# Patient Record
Sex: Male | Born: 1975 | Race: Black or African American | Hispanic: No | Marital: Married | Smoking: Current every day smoker
Health system: Southern US, Community
[De-identification: ages and names within clinical notes are randomized; demographics above are authoritative.]

---

## 2005-12-20 ENCOUNTER — Emergency Department (HOSPITAL_COMMUNITY): Admission: EM | Admit: 2005-12-20 | Discharge: 2005-12-20 | Payer: Self-pay | Admitting: Emergency Medicine

## 2006-03-17 ENCOUNTER — Emergency Department (HOSPITAL_COMMUNITY): Admission: EM | Admit: 2006-03-17 | Discharge: 2006-03-17 | Payer: Self-pay | Admitting: Emergency Medicine

## 2007-10-01 ENCOUNTER — Emergency Department (HOSPITAL_COMMUNITY): Admission: EM | Admit: 2007-10-01 | Discharge: 2007-10-01 | Payer: Self-pay | Admitting: Emergency Medicine

## 2007-11-20 ENCOUNTER — Emergency Department (HOSPITAL_COMMUNITY): Admission: EM | Admit: 2007-11-20 | Discharge: 2007-11-20 | Payer: Self-pay | Admitting: Emergency Medicine

## 2008-11-27 ENCOUNTER — Emergency Department (HOSPITAL_COMMUNITY): Admission: EM | Admit: 2008-11-27 | Discharge: 2008-11-27 | Payer: Self-pay | Admitting: Emergency Medicine

## 2009-07-21 ENCOUNTER — Emergency Department (HOSPITAL_COMMUNITY): Admission: EM | Admit: 2009-07-21 | Discharge: 2009-07-21 | Payer: Self-pay | Admitting: Emergency Medicine

## 2010-04-29 ENCOUNTER — Emergency Department (HOSPITAL_COMMUNITY): Admission: EM | Admit: 2010-04-29 | Discharge: 2010-04-29 | Payer: Self-pay | Admitting: Emergency Medicine

## 2010-09-29 ENCOUNTER — Emergency Department (HOSPITAL_COMMUNITY)
Admission: EM | Admit: 2010-09-29 | Discharge: 2010-09-29 | Payer: Self-pay | Source: Home / Self Care | Admitting: Emergency Medicine

## 2010-11-07 ENCOUNTER — Encounter: Payer: Self-pay | Admitting: Internal Medicine

## 2011-01-02 LAB — COMPREHENSIVE METABOLIC PANEL
AST: 22 U/L (ref 0–37)
Albumin: 4.5 g/dL (ref 3.5–5.2)
Alkaline Phosphatase: 51 U/L (ref 39–117)
BUN: 8 mg/dL (ref 6–23)
CO2: 29 mEq/L (ref 19–32)
Creatinine, Ser: 1 mg/dL (ref 0.4–1.5)
Sodium: 139 mEq/L (ref 135–145)

## 2011-01-02 LAB — URINALYSIS, ROUTINE W REFLEX MICROSCOPIC
Glucose, UA: NEGATIVE mg/dL
Ketones, ur: NEGATIVE mg/dL
Nitrite: NEGATIVE
Specific Gravity, Urine: <1.005 — ABNORMAL LOW (ref 1.005–1.030)

## 2011-01-02 LAB — CBC
Hemoglobin: 14.3 g/dL (ref 13.0–17.0)
MCH: 30.2 pg (ref 26.0–34.0)
MCHC: 33.2 g/dL (ref 30.0–36.0)
RBC: 4.74 MIL/uL (ref 4.22–5.81)
RDW: 12 % (ref 11.5–15.5)
WBC: 10.3 10*3/uL (ref 4.0–10.5)

## 2011-01-02 LAB — DIFFERENTIAL
Eosinophils Absolute: 0.5 10*3/uL (ref 0.0–0.7)
Lymphs Abs: 2.7 10*3/uL (ref 0.7–4.0)
Monocytes Absolute: 0.8 10*3/uL (ref 0.1–1.0)
Monocytes Relative: 8 % (ref 3–12)
Neutro Abs: 6 10*3/uL (ref 1.7–7.7)

## 2011-01-02 LAB — LIPASE, BLOOD: Lipase: 19 U/L (ref 11–59)

## 2011-02-01 LAB — DIFFERENTIAL
Basophils Absolute: 0.1 10*3/uL (ref 0.0–0.1)
Eosinophils Absolute: 0.4 10*3/uL (ref 0.0–0.7)
Eosinophils Relative: 4 % (ref 0–5)
Lymphocytes Relative: 20 % (ref 12–46)
Monocytes Relative: 11 % (ref 3–12)
Neutrophils Relative %: 64 % (ref 43–77)

## 2011-02-01 LAB — COMPREHENSIVE METABOLIC PANEL
Albumin: 3.8 g/dL (ref 3.5–5.2)
CO2: 29 mEq/L (ref 19–32)
Calcium: 9 mg/dL (ref 8.4–10.5)
Creatinine, Ser: 1.02 mg/dL (ref 0.4–1.5)
GFR calc Af Amer: >60 mL/min (ref >60)
GFR calc non Af Amer: >60 mL/min (ref >60)
Sodium: 137 mEq/L (ref 135–145)
Total Bilirubin: 0.5 mg/dL (ref 0.3–1.2)

## 2011-02-01 LAB — CBC
Hemoglobin: 13.6 g/dL (ref 13.0–17.0)
MCHC: 33.9 g/dL (ref 30.0–36.0)
Platelets: 243 10*3/uL (ref 150–400)
RBC: 4.48 MIL/uL (ref 4.22–5.81)
RDW: 12.6 % (ref 11.5–15.5)
WBC: 10.2 10*3/uL (ref 4.0–10.5)

## 2011-02-01 LAB — URINALYSIS, ROUTINE W REFLEX MICROSCOPIC
Glucose, UA: NEGATIVE mg/dL
Hgb urine dipstick: NEGATIVE
Nitrite: NEGATIVE
Protein, ur: NEGATIVE mg/dL
Specific Gravity, Urine: 1.02 (ref 1.005–1.030)
Urobilinogen, UA: 0.2 mg/dL (ref 0.0–1.0)

## 2011-07-21 ENCOUNTER — Encounter: Payer: Self-pay | Admitting: Emergency Medicine

## 2011-07-21 ENCOUNTER — Emergency Department (HOSPITAL_COMMUNITY)
Admission: EM | Admit: 2011-07-21 | Discharge: 2011-07-21 | Disposition: A | Discharge status: Home / Self Care | Payer: Self-pay | Attending: Emergency Medicine | Admitting: Emergency Medicine

## 2011-07-21 ENCOUNTER — Emergency Department (HOSPITAL_COMMUNITY): Payer: Self-pay

## 2011-07-21 DIAGNOSIS — R110 Nausea: Secondary | ICD-10-CM | POA: Insufficient documentation

## 2011-07-21 DIAGNOSIS — R3129 Other microscopic hematuria: Secondary | ICD-10-CM | POA: Insufficient documentation

## 2011-07-21 DIAGNOSIS — R350 Frequency of micturition: Secondary | ICD-10-CM | POA: Insufficient documentation

## 2011-07-21 DIAGNOSIS — Z87891 Personal history of nicotine dependence: Secondary | ICD-10-CM | POA: Insufficient documentation

## 2011-07-21 DIAGNOSIS — R109 Unspecified abdominal pain: Secondary | ICD-10-CM | POA: Insufficient documentation

## 2011-07-21 LAB — URINE MICROSCOPIC-ADD ON

## 2011-07-21 LAB — URINALYSIS, ROUTINE W REFLEX MICROSCOPIC: Bilirubin Urine: NEGATIVE

## 2011-07-21 LAB — GLUCOSE, CAPILLARY: Glucose-Capillary: 106 mg/dL — ABNORMAL HIGH (ref 70–99)

## 2011-07-21 NOTE — ED Notes (Signed)
Pt blood sugar was 106

## 2011-07-21 NOTE — ED Provider Notes (Signed)
Medical screening examination/treatment/procedure(s) were performed by non-physician practitioner and as supervising physician I was immediately available for consultation/collaboration.   Joya Gaskins, MD 07/21/11 [phone]

## 2011-07-21 NOTE — ED Notes (Signed)
Pt is currently in CT.

## 2011-07-21 NOTE — ED Notes (Signed)
PT c/o L side abd pain started this am. States had a bm and felt a little better. Denies trouble urinating. bm this am was normal and denies bloody or black stools. Cramping pain.

## 2011-07-21 NOTE — ED Provider Notes (Signed)
History     CSN: [id number] Arrival date & time: 07/21/2011 11:57 AM  Chief Complaint  Patient presents with  . Abdominal Pain    (Consider location/radiation/quality/duration/timing/severity/associated sxs/prior treatment) Patient is a 35 y.o. male presenting with abdominal pain. The history is provided by the patient.  Abdominal Pain The primary symptoms of the illness include abdominal pain and nausea. The primary symptoms of the illness do not include fever, shortness of breath, vomiting, diarrhea or dysuria. Primary symptoms comment: He reports approximately 1 hour episode of severe left side and left lower abdominal pain which improved shortly after having a bowel movement this morning.  He states the pain was severe and "balled him up".  He also had increased urinary frequency.   The current episode started 3 to 5 hours ago. The onset of the illness was sudden. The problem has been resolved.  The pain came on suddenly. The abdominal pain has been resolved since its onset. The abdominal pain is located in the LLQ and left flank. The abdominal pain does not radiate. The severity of the abdominal pain is 7/10 (He reports a normal bm this am,  denies diarrhea and constipation). The abdominal pain is relieved by bowel movement.  Additional symptoms associated with the illness include frequency. Symptoms associated with the illness do not include constipation or hematuria.    History reviewed. No pertinent past medical history.  History reviewed. No pertinent past surgical history.  History reviewed. No pertinent family history.  History  Substance Use Topics  . Smoking status: Former Games developer  . Smokeless tobacco: Not on file  . Alcohol Use: No      Review of Systems  Constitutional: Negative for fever.  HENT: Negative for congestion, sore throat and neck pain.   Eyes: Negative.   Respiratory: Negative for chest tightness and shortness of breath.   Cardiovascular: Negative for  chest pain.  Gastrointestinal: Positive for nausea and abdominal pain. Negative for vomiting, diarrhea, constipation, blood in stool and rectal pain.  Genitourinary: Positive for frequency. Negative for dysuria and hematuria.  Musculoskeletal: Negative for joint swelling and arthralgias.  Skin: Negative.  Negative for rash and wound.  Neurological: Negative for dizziness, weakness, light-headedness, numbness and headaches.  Hematological: Negative.   Psychiatric/Behavioral: Negative.     Allergies  Codeine  Home Medications  No current outpatient prescriptions on file.  BP 133/70  Pulse 73  Temp(Src) 98.6 F (37 C) (Oral)  Resp 20  Ht 6\' 3"  (1.905 m)  Wt 178 lb 11.2 oz (81.058 kg)  BMI 22.34 kg/m2  SpO2 100%  Physical Exam  Nursing note and vitals reviewed. Constitutional: He is oriented to person, place, and time. He appears well-developed and well-nourished.  HENT:  Head: Normocephalic and atraumatic.  Eyes: Conjunctivae are normal.  Neck: Normal range of motion.  Cardiovascular: Normal rate, regular rhythm, normal heart sounds and intact distal pulses.   Pulmonary/Chest: Effort normal and breath sounds normal. He has no wheezes.  Abdominal: Soft. Bowel sounds are normal. He exhibits no mass. There is no tenderness. There is no rebound and no guarding.  Musculoskeletal: Normal range of motion.  Neurological: He is alert and oriented to person, place, and time.  Skin: Skin is warm and dry.  Psychiatric: He has a normal mood and affect.    ED Course  Procedures (including critical care time)  Labs Reviewed  URINALYSIS, ROUTINE W REFLEX MICROSCOPIC - Abnormal; Notable for the following:    Hgb urine dipstick MODERATE (*)  All other components within normal limits  GLUCOSE, CAPILLARY - Abnormal; Notable for the following:    Glucose-Capillary 106 (*)    All other components within normal limits  URINE MICROSCOPIC-ADD ON   Ct Abdomen Pelvis Wo  Contrast  07/21/2011  *RADIOLOGY REPORT*  Clinical Data: Left flank pain and microhematuria.  CT ABDOMEN AND PELVIS WITHOUT CONTRAST  Technique:  Multidetector CT imaging of the abdomen and pelvis was performed following the standard protocol without intravenous contrast.  Comparison: CT scan 04/29/2010.  Findings: The lung bases are clear.  The solid abdominal organs are unremarkable without IV contrast. No focal lesions or inflammatory changes.  No renal or obstructing ureteral calculi.  No bladder calculi.  The stomach, duodenum, small bowel and colon are grossly normal without oral contrast.  No mesenteric or retroperitoneal masses or lymphadenopathy.  The aorta is normal in caliber. The appendix is normal except for a small appendicolith.  The bladder, prostate gland and seminal vesicles are unremarkable. The bony structures are unremarkable.  IMPRESSION: No acute abdominal/pelvic findings, mass lesions or adenopathy.  No renal or obstructing ureteral calculi.  Original Report Authenticated By: P. Loralie Champagne, M.D.     1. Abdominal pain   2. Hematuria, microscopic       MDM  Suspect patient may have passed kidney stone given hematuria,  Resolved location of pain.  Recommended f/u pcp in 1-2 weeks for recheck of urine.        Candis Musa, PA 07/21/11 1507

## 2011-07-26 ENCOUNTER — Emergency Department (HOSPITAL_COMMUNITY)
Admission: EM | Admit: 2011-07-26 | Discharge: 2011-07-26 | Disposition: A | Discharge status: Home / Self Care | Payer: Self-pay | Attending: Emergency Medicine | Admitting: Emergency Medicine

## 2011-07-26 ENCOUNTER — Encounter (HOSPITAL_COMMUNITY): Payer: Self-pay | Admitting: *Deleted

## 2011-07-26 DIAGNOSIS — X500XXA Overexertion from strenuous movement or load, initial encounter: Secondary | ICD-10-CM | POA: Insufficient documentation

## 2011-07-26 DIAGNOSIS — R300 Dysuria: Secondary | ICD-10-CM | POA: Insufficient documentation

## 2011-07-26 DIAGNOSIS — S39011A Strain of muscle, fascia and tendon of abdomen, initial encounter: Secondary | ICD-10-CM

## 2011-07-26 DIAGNOSIS — F17200 Nicotine dependence, unspecified, uncomplicated: Secondary | ICD-10-CM | POA: Insufficient documentation

## 2011-07-26 DIAGNOSIS — IMO0002 Reserved for concepts with insufficient information to code with codable children: Secondary | ICD-10-CM | POA: Insufficient documentation

## 2011-07-26 DIAGNOSIS — R109 Unspecified abdominal pain: Secondary | ICD-10-CM | POA: Insufficient documentation

## 2011-07-26 LAB — URINALYSIS, ROUTINE W REFLEX MICROSCOPIC
Glucose, UA: NEGATIVE mg/dL
Hgb urine dipstick: NEGATIVE
Nitrite: NEGATIVE
Specific Gravity, Urine: 1.025 (ref 1.005–1.030)
pH: 6 (ref 5.0–8.0)

## 2011-07-26 NOTE — ED Provider Notes (Signed)
History     CSN: [id number] Arrival date & time: 07/26/2011  9:21 AM  Chief Complaint  Patient presents with  . Abdominal Pain    (Consider location/radiation/quality/duration/timing/severity/associated sxs/prior treatment) HPI Comments: Pt states he had a kidney stone ~ 1 week ago.  His pain today is not similar.  He lifted a heavy object at work last PM and had an immediate "burning" sensation around his umbilicus.  It also was uncomfortable when he urinated earlier.    No n/v/d.  No fever.    Patient is a 35 y.o. male presenting with abdominal pain. The history is provided by the patient. No language interpreter was used.  Abdominal Pain The primary symptoms of the illness include abdominal pain and dysuria. The primary symptoms of the illness do not include fever, nausea, vomiting, diarrhea, hematemesis or hematochezia. The current episode started less than 1 hour ago. The onset of the illness was sudden.  The abdominal pain began less than 1 hour ago. The pain came on suddenly. The abdominal pain is located in the periumbilical region. The abdominal pain does not radiate. The abdominal pain is relieved by nothing. The abdominal pain is exacerbated by urination.  The dysuria is not associated with hematuria, frequency, urgency or penile pain.  Symptoms associated with the illness do not include diaphoresis, urgency, hematuria, frequency or back pain. Associated medical issues comments: kidney stones..    History reviewed. No pertinent past medical history.  History reviewed. No pertinent past surgical history.  History reviewed. No pertinent family history.  History  Substance Use Topics  . Smoking status: Current Everyday Smoker -- 0.5 packs/day  . Smokeless tobacco: Not on file  . Alcohol Use: No      Review of Systems  Constitutional: Negative for fever and diaphoresis.  Gastrointestinal: Positive for abdominal pain. Negative for nausea, vomiting, diarrhea, hematochezia  and hematemesis.  Genitourinary: Positive for dysuria. Negative for urgency, frequency, hematuria, flank pain, discharge, penile swelling, scrotal swelling, penile pain and testicular pain.  Musculoskeletal: Negative for back pain.  All other systems reviewed and are negative.    Allergies  Codeine  Home Medications  No current outpatient prescriptions on file.  BP 143/81  Pulse 77  Temp(Src) 97.9 F (36.6 C) (Oral)  Resp 16  Ht 6\' 3"  (1.905 m)  Wt 178 lb (80.74 kg)  BMI 22.25 kg/m2  SpO2 100%  Physical Exam  Nursing note and vitals reviewed. Constitutional: He is oriented to person, place, and time. Vital signs are normal. He appears well-developed and well-nourished. No distress.  HENT:  Head: Normocephalic and atraumatic.  Right Ear: External ear normal.  Left Ear: External ear normal.  Nose: Nose normal.  Mouth/Throat: No oropharyngeal exudate.  Eyes: Conjunctivae and EOM are normal. Pupils are equal, round, and reactive to light. Right eye exhibits no discharge. Left eye exhibits no discharge. No scleral icterus.  Neck: Normal range of motion. Neck supple. No JVD present. No tracheal deviation present. No thyromegaly present.  Cardiovascular: Normal rate, regular rhythm, normal heart sounds, intact distal pulses and normal pulses.  Exam reveals no gallop and no friction rub.   No murmur heard. Pulmonary/Chest: Effort normal and breath sounds normal. No stridor. No respiratory distress. He has no wheezes. He has no rales. He exhibits no tenderness.  Abdominal: Soft. Normal appearance and bowel sounds are normal. He exhibits no shifting dullness, no distension, no pulsatile liver, no fluid wave, no abdominal bruit, no ascites, no pulsatile midline mass and no mass.  There is no hepatosplenomegaly. There is no tenderness. There is no rebound, no guarding and no CVA tenderness. No hernia. Hernia confirmed negative in the right inguinal area and confirmed negative in the left  inguinal area.         No RLQ or RUQ pain or PT.  Genitourinary: Testes normal and penis normal. Right testis shows no mass, no swelling and no tenderness. Right testis is descended. Left testis shows no mass, no swelling and no tenderness. Left testis is descended. Circumcised. No phimosis, paraphimosis, hypospadias, penile erythema or penile tenderness. No discharge found.  Musculoskeletal: Normal range of motion. He exhibits no edema and no tenderness.  Lymphadenopathy:    He has no cervical adenopathy.       Right: No inguinal adenopathy present.       Left: No inguinal adenopathy present.  Neurological: He is alert and oriented to person, place, and time. He has normal reflexes. Coordination normal. GCS eye subscore is 4. GCS verbal subscore is 5. GCS motor subscore is 6.  Skin: Skin is warm and dry. No rash noted. He is not diaphoretic.  Psychiatric: He has a normal mood and affect. His speech is normal and behavior is normal. Judgment and thought content normal. Cognition and memory are normal.    ED Course  Procedures (including critical care time)  Labs Reviewed - No data to display No results found.   No diagnosis found.    MDM    Medical screening examination/treatment/procedure(s) were performed by non-physician practitioner and as supervising physician I was immediately available for consultation/collaboration.      Worthy Rancher, PA 07/26/11 1610  Suzi Roots, MD 07/26/11 1058

## 2011-07-26 NOTE — ED Notes (Signed)
Pt c/o abd pain. Pt states he feels like he has pulled a muscle at work yesterday. Pt states pain in located around navel.

## 2012-07-04 ENCOUNTER — Emergency Department (HOSPITAL_COMMUNITY)
Admission: EM | Admit: 2012-07-04 | Discharge: 2012-07-04 | Disposition: A | Discharge status: Home / Self Care | Payer: Self-pay | Attending: Emergency Medicine | Admitting: Emergency Medicine

## 2012-07-04 ENCOUNTER — Other Ambulatory Visit: Payer: Self-pay

## 2012-07-04 ENCOUNTER — Emergency Department (HOSPITAL_COMMUNITY): Payer: Self-pay

## 2012-07-04 ENCOUNTER — Encounter (HOSPITAL_COMMUNITY): Payer: Self-pay | Admitting: Emergency Medicine

## 2012-07-04 DIAGNOSIS — F17200 Nicotine dependence, unspecified, uncomplicated: Secondary | ICD-10-CM | POA: Insufficient documentation

## 2012-07-04 DIAGNOSIS — R0789 Other chest pain: Secondary | ICD-10-CM

## 2012-07-04 DIAGNOSIS — Z7982 Long term (current) use of aspirin: Secondary | ICD-10-CM | POA: Insufficient documentation

## 2012-07-04 DIAGNOSIS — Z885 Allergy status to narcotic agent status: Secondary | ICD-10-CM | POA: Insufficient documentation

## 2012-07-04 DIAGNOSIS — Z91040 Latex allergy status: Secondary | ICD-10-CM | POA: Insufficient documentation

## 2012-07-04 DIAGNOSIS — R079 Chest pain, unspecified: Secondary | ICD-10-CM | POA: Insufficient documentation

## 2012-07-04 MED ORDER — HYDROCODONE-ACETAMINOPHEN 5-325 MG PO TABS
1.0000 | ORAL_TABLET | Freq: Once | ORAL | Status: AC
  Administered 2012-07-04: 1 via ORAL
  Filled 2012-07-04: qty 1

## 2012-07-04 MED ORDER — TRAMADOL HCL 50 MG PO TABS: 50.0000 mg | ORAL_TABLET | Freq: Four times a day (QID) | ORAL | Status: DC | PRN

## 2012-07-04 NOTE — ED Provider Notes (Cosign Needed)
History  This chart was scribed for Benny Lennert, MD by Shari Heritage. The patient was seen in room APA10/APA10. Patient's care was started at 1210.     CSN: [id number]  Arrival date & time 07/04/12  1036   First MD Initiated Contact with Patient 07/04/12 1210      Chief Complaint  Patient presents with  . Chest Pain    Patient is a 36 y.o. male presenting with chest pain. The history is provided by the patient. No language interpreter was used.  Chest Pain The chest pain began 2 days ago. Chest pain occurs constantly. The chest pain is worsening. The severity of the pain is moderate. The quality of the pain is described as aching. The pain does not radiate. Chest pain is worsened by exertion. Pertinent negatives for primary symptoms include no fatigue, no shortness of breath, no cough and no abdominal pain.  Pertinent negatives for associated symptoms include no diaphoresis. He tried aspirin for the symptoms. Risk factors include no known risk factors.  Pertinent negatives for past medical history include no seizures.      Chad Christian is a 36 y.o. male who presents to the Emergency Department complaining of aching, moderate to severe, constant mid chest pain onset 2 days ago that worsened last night. The pain worsens with movement. Patient states that he thinks the pain may be muscular because he lifts heavy objects at his job. He took Bayer Aspirin last night with only mild relief. He denies diaphoresis or SOB. Patient reports no other significant past medical or surgical history. He is a current every day smoker.  History reviewed. No pertinent past medical history.  History reviewed. No pertinent past surgical history.  No family history on file.  History  Substance Use Topics  . Smoking status: Current Every Day Smoker -- 0.5 packs/day  . Smokeless tobacco: Not on file  . Alcohol Use: No      Review of Systems  Constitutional: Negative for diaphoresis and  fatigue.  HENT: Negative for congestion, sinus pressure and ear discharge.   Eyes: Negative for discharge.  Respiratory: Negative for cough and shortness of breath.   Cardiovascular: Positive for chest pain.  Gastrointestinal: Negative for abdominal pain and diarrhea.  Genitourinary: Negative for frequency and hematuria.  Musculoskeletal: Negative for back pain.  Skin: Negative for rash.  Neurological: Negative for seizures and headaches.  Hematological: Negative.   Psychiatric/Behavioral: Negative for hallucinations.    Allergies  Codeine and Latex  Home Medications   Current Outpatient Rx  Name Route Sig Dispense Refill  . ASPIRIN 325 MG PO TABS Oral Take 650 mg by mouth every 8 (eight) hours as needed. Pain    . CALCIUM CARBONATE ANTACID 500 MG PO CHEW Oral Chew 1 tablet by mouth 3 (three) times daily as needed. Acid Reflux      BP 137/81  Pulse 67  Temp 97.4 F (36.3 C) (Oral)  Resp 18  Ht 6\' 1"  (1.854 m)  Wt 170 lb (77.111 kg)  BMI 22.43 kg/m2  SpO2 100%  Physical Exam  Constitutional: He is oriented to person, place, and time. He appears well-developed.  HENT:  Head: Normocephalic and atraumatic.  Eyes: Conjunctivae normal and EOM are normal. No scleral icterus.  Neck: Neck supple. No thyromegaly present.  Cardiovascular: Normal rate and regular rhythm.  Exam reveals no gallop and no friction rub.   No murmur heard. Pulmonary/Chest: No stridor. He has no wheezes. He has no rales. He  exhibits tenderness.       Mild tenderness to sternum.  Abdominal: He exhibits no distension. There is no tenderness. There is no rebound.  Musculoskeletal: Normal range of motion. He exhibits no edema.  Lymphadenopathy:    He has no cervical adenopathy.  Neurological: He is oriented to person, place, and time. Coordination normal.  Skin: No rash noted. No erythema.  Psychiatric: He has a normal mood and affect. His behavior is normal.    ED Course  Procedures (including  critical care time) DIAGNOSTIC STUDIES: Oxygen Saturation is 100% on room air, normal by my interpretation.    COORDINATION OF CARE: 12:15pm- Patient informed of current plan for treatment and evaluation and agrees with plan at this time.      Labs Reviewed - No data to display No results found.   No diagnosis found.   Date: 07/04/2012  Rate64  Rhythm: normal sinus rhythm  QRS Axis: normal  Intervals: normal  ST/T Wave abnormalities: early repolarization  Conduction Disutrbances:none  Narrative Interpretation:   Old EKG Reviewed: none available    MDM        The chart was scribed for me under my direct supervision.  I personally performed the history, physical, and medical decision making and all procedures in the evaluation of this patient.Benny Lennert, MD 07/04/12 1425  Benny Lennert, MD 07/04/12 [phone]

## 2012-07-04 NOTE — ED Notes (Signed)
Pt c/o chest pain x2 days. Pt describes pain and tight and pulling. Pt states he does a lot of heavy lifting at work and thinks he may have pulled a muscle. Pt states "I pulled my pectoris muscle before and this feels the same". Pt denies SOB, dizziness, or any other symptoms.

## 2012-07-04 NOTE — ED Notes (Signed)
Patient with c/o mid and left chest pain that started two days ago while working. Pain is worse with movement.

## 2012-07-11 ENCOUNTER — Encounter (HOSPITAL_COMMUNITY): Payer: Self-pay | Admitting: Emergency Medicine

## 2012-07-11 DIAGNOSIS — F17200 Nicotine dependence, unspecified, uncomplicated: Secondary | ICD-10-CM | POA: Insufficient documentation

## 2012-07-11 DIAGNOSIS — Z91040 Latex allergy status: Secondary | ICD-10-CM | POA: Insufficient documentation

## 2012-07-11 DIAGNOSIS — R071 Chest pain on breathing: Secondary | ICD-10-CM | POA: Insufficient documentation

## 2012-07-11 DIAGNOSIS — Z886 Allergy status to analgesic agent status: Secondary | ICD-10-CM | POA: Insufficient documentation

## 2012-07-11 NOTE — ED Notes (Signed)
Patient c/o continued chest pain; was seen on 07/04/12 for same.  Patient states he feels like he has pulled a muscle in his chest.  No shortness of breath, N/V.

## 2012-07-12 ENCOUNTER — Emergency Department (HOSPITAL_COMMUNITY)
Admission: EM | Admit: 2012-07-12 | Discharge: 2012-07-12 | Disposition: A | Discharge status: Home / Self Care | Payer: Self-pay | Attending: Emergency Medicine | Admitting: Emergency Medicine

## 2012-07-12 DIAGNOSIS — R0789 Other chest pain: Secondary | ICD-10-CM

## 2012-07-12 MED ORDER — IBUPROFEN 800 MG PO TABS: 800.0000 mg | ORAL_TABLET | Freq: Three times a day (TID) | ORAL | Status: AC

## 2012-07-12 MED ORDER — IBUPROFEN 800 MG PO TABS
800.0000 mg | ORAL_TABLET | Freq: Once | ORAL | Status: AC
  Administered 2012-07-12: 800 mg via ORAL
  Filled 2012-07-12: qty 1

## 2012-07-12 NOTE — ED Notes (Signed)
Pt alert & oriented x4, stable gait. Patient given discharge instructions, paperwork & prescription(s). Patient  instructed to stop at the registration desk to finish any additional paperwork. Patient verbalized understanding. Pt left department w/ no further questions. 

## 2012-07-12 NOTE — ED Provider Notes (Signed)
History     CSN: [id number]  Arrival date & time 07/11/12  2319   First MD Initiated Contact with Patient 07/12/12 0023      Chief Complaint  Patient presents with  . Muscle Pain    (Consider location/radiation/quality/duration/timing/severity/associated sxs/prior treatment) HPI Chad Christian is a 36 y.o. male who presents to the Emergency Department complaining of recurrent anterior chest pain that is worse when he moves or with palpation. He was seen for similar pain on 07/04/12, dx with muscular pain and given tramadol which he did not take. 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 He has taken no medicines. Denies fever, chills, cough, shortness of breath.  PCP Dr. Sherwood Gambler    History reviewed. No pertinent past medical history.  History reviewed. No pertinent past surgical history.  No family history on file.  History  Substance Use Topics  . Smoking status: Current Every Day Smoker -- 0.5 packs/day  . Smokeless tobacco: Not on file  . Alcohol Use: No      Review of Systems  Constitutional: Negative for fever.       10 Systems reviewed and are negative for acute change except as noted in the HPI.  HENT: Negative for congestion.   Eyes: Negative for discharge and redness.  Respiratory: Negative for cough and shortness of breath.        Chest discomfort.  Cardiovascular: Negative for chest pain.  Gastrointestinal: Negative for vomiting and abdominal pain.  Musculoskeletal: Negative for back pain.  Skin: Negative for rash.  Neurological: Negative for syncope, numbness and headaches.  Psychiatric/Behavioral:       No behavior change.    Allergies  Codeine and Latex  Home Medications   Current Outpatient Rx  Name Route Sig Dispense Refill  . ASPIRIN 325 MG PO TABS Oral Take 650 mg by mouth every 8 (eight) hours as needed. Pain    . CALCIUM CARBONATE ANTACID 500 MG PO CHEW Oral Chew 1 tablet by mouth 3 (three) times daily as needed. Acid Reflux    .  IBUPROFEN 800 MG PO TABS Oral Take 1 tablet (800 mg total) by mouth 3 (three) times daily. 21 tablet 0  . TRAMADOL HCL 50 MG PO TABS Oral Take 1 tablet (50 mg total) by mouth every 6 (six) hours as needed for pain. 20 tablet 0    BP 133/83  Pulse 86  Temp 98.2 F (36.8 C) (Oral)  Resp 18  Ht 6\' 2"  (1.88 m)  Wt 185 lb (83.915 kg)  BMI 23.75 kg/m2  SpO2 97%  Physical Exam  Nursing note and vitals reviewed. Constitutional:       Awake, alert, nontoxic appearance.  HENT:  Head: Atraumatic.  Eyes: Right eye exhibits no discharge. Left eye exhibits no discharge.  Neck: Neck supple.  Cardiovascular: Normal heart sounds.   Pulmonary/Chest: Effort normal and breath sounds normal. He has no wheezes. He has no rales. He exhibits no tenderness.       Tenderness to sternal border both right and left with palpation. No crepitus, no deformity.  Abdominal: Soft. There is no tenderness. There is no rebound.  Musculoskeletal: He exhibits no tenderness.       Baseline ROM, no obvious new focal weakness.  Neurological:       Mental status and motor strength appears baseline for patient and situation.  Skin: No rash noted.  Psychiatric: He has a normal mood and affect.    ED Course  Procedures (including  critical care time)  Labs Reviewed - No data to display No results found.   1. Chest wall pain       MDM  Patient with chest wall pain that has been seen for similar pain and chose not to take prescribed medicine. Given ibuprofen here and he agreed to a Rx for the same. Pt stable in ED with no significant deterioration in condition.The patient appears reasonably screened and/or stabilized for discharge and I doubt any other medical condition or other [facility] requiring further screening, evaluation, or treatment in the ED at this time prior to discharge.  MDM Reviewed: nursing note, vitals and previous chart           Nicoletta Dress. Colon Branch, MD 07/12/12 [phone]

## 2012-07-12 NOTE — ED Notes (Signed)
Pt states feels like he strained a muscle in his chest again today. Was seen last week for the same. Pain more in the center increased pain w/ movement

## 2012-07-30 ENCOUNTER — Emergency Department (HOSPITAL_COMMUNITY): Payer: Self-pay

## 2012-07-30 ENCOUNTER — Encounter (HOSPITAL_COMMUNITY): Payer: Self-pay | Admitting: *Deleted

## 2012-07-30 ENCOUNTER — Emergency Department (HOSPITAL_COMMUNITY)
Admission: EM | Admit: 2012-07-30 | Discharge: 2012-07-30 | Disposition: A | Discharge status: Home / Self Care | Payer: Self-pay | Attending: Emergency Medicine | Admitting: Emergency Medicine

## 2012-07-30 DIAGNOSIS — F17200 Nicotine dependence, unspecified, uncomplicated: Secondary | ICD-10-CM | POA: Insufficient documentation

## 2012-07-30 DIAGNOSIS — R109 Unspecified abdominal pain: Secondary | ICD-10-CM

## 2012-07-30 DIAGNOSIS — R1011 Right upper quadrant pain: Secondary | ICD-10-CM | POA: Insufficient documentation

## 2012-07-30 DIAGNOSIS — R1012 Left upper quadrant pain: Secondary | ICD-10-CM | POA: Insufficient documentation

## 2012-07-30 NOTE — ED Notes (Signed)
Pt discharged. Pt stable at time of discharge. pt has no questions regarding discharge at this time. Pt voiced understanding of discharge instructions.  

## 2012-07-30 NOTE — ED Provider Notes (Signed)
History     CSN: [id number]  Arrival date & time 07/30/12  0005   First MD Initiated Contact with Patient 07/30/12 0153      Chief Complaint  Patient presents with  . Abdominal Pain    (Consider location/radiation/quality/duration/timing/severity/associated sxs/prior treatment) HPI Chad Christian is a 36 y.o. male who presents to the Emergency Department complaining of right sided abdominal pain he has had for several days which became worse tonight. Last BM just prior to coming to the ER and was normal. He denies fever, chills, nausea, vomiting.   PCP Dr. Sherwood Gambler   History reviewed. No pertinent past medical history.  History reviewed. No pertinent past surgical history.  History reviewed. No pertinent family history.  History  Substance Use Topics  . Smoking status: Current Every Day Smoker    Types: Cigarettes  . Smokeless tobacco: Not on file  . Alcohol Use: No      Review of Systems  Constitutional: Negative for fever.       10 Systems reviewed and are negative for acute change except as noted in the HPI.  HENT: Negative for congestion.   Eyes: Negative for discharge and redness.  Respiratory: Negative for cough and shortness of breath.   Cardiovascular: Negative for chest pain.  Gastrointestinal: Positive for abdominal pain. Negative for vomiting.  Musculoskeletal: Negative for back pain.  Skin: Negative for rash.  Neurological: Negative for syncope, numbness and headaches.  Psychiatric/Behavioral:       No behavior change.    Allergies  Codeine and Latex  Home Medications   Current Outpatient Rx  Name Route Sig Dispense Refill  . ASPIRIN 325 MG PO TABS Oral Take 650 mg by mouth every 8 (eight) hours as needed. Pain    . CALCIUM CARBONATE ANTACID 500 MG PO CHEW Oral Chew 1 tablet by mouth 3 (three) times daily as needed. Acid Reflux    . IBUPROFEN 800 MG PO TABS Oral Take 1 tablet (800 mg total) by mouth 3 (three) times daily. 21 tablet 0  .  TRAMADOL HCL 50 MG PO TABS Oral Take 1 tablet (50 mg total) by mouth every 6 (six) hours as needed for pain. 20 tablet 0    BP 122/97  Pulse 64  Temp 98 F (36.7 C) (Oral)  Resp 16  Ht 6' 2.5" (1.892 m)  Wt 185 lb (83.915 kg)  BMI 23.43 kg/m2  SpO2 100%  Physical Exam  Nursing note and vitals reviewed. Constitutional:       Awake, alert, nontoxic appearance.  HENT:  Head: Atraumatic.  Eyes: Right eye exhibits no discharge. Left eye exhibits no discharge.  Neck: Neck supple.  Pulmonary/Chest: Effort normal. He exhibits no tenderness.  Abdominal: Soft. There is tenderness. There is no rebound.       Mild tenderness to palpation on right abdomen both upper and lower.  Musculoskeletal: He exhibits no tenderness.       Baseline ROM, no obvious new focal weakness.  Neurological:       Mental status and motor strength appears baseline for patient and situation.  Skin: No rash noted.  Psychiatric: He has a normal mood and affect.    ED Course  Procedures (including critical care time)  Dg Abd Acute W/chest  07/30/2012  *RADIOLOGY REPORT*  Clinical Data: Mid abdominal pain.  ACUTE ABDOMEN SERIES (ABDOMEN 2 VIEW & CHEST 1 VIEW)  Comparison: Chest 07/04/2012.  CT abdomen and pelvis 07/21/2011.  Findings: Mild hyperinflation could represent airways disease  or asthma.  Normal heart size and pulmonary vascularity.  No focal airspace consolidation in the lungs.  No blunting of costophrenic angles.  No pneumothorax.  Mediastinal contours appear intact.  Gas and stool in the colon.  No small or large bowel distension. No free intra-abdominal air.  No abnormal air fluid levels.  No radiopaque stones.  Visualized bones appear intact.  IMPRESSION: No evidence of active pulmonary disease.  Nonobstructive bowel gas pattern.   Original Report Authenticated By: Marlon Pel, M.D.     MDM  Patient with abdominal pain x several days. Xrays with stool and gas. Dx testing d/w pt and wife.  Questions answered.  Verb understanding, agreeable to d/c home with outpt f/u.Pt stable in ED with no significant deterioration in condition.The patient appears reasonably screened and/or stabilized for discharge and I doubt any other medical condition or other [facility] requiring further screening, evaluation, or treatment in the ED at this time prior to discharge.  MDM Reviewed: nursing note and vitals Interpretation: x-ray           Nicoletta Dress. Colon Branch, MD 07/30/12 [phone]

## 2012-07-30 NOTE — ED Notes (Addendum)
Pt c/o abd pain cramp like in center of abd. Pt denies nvd.

## 2013-06-17 ENCOUNTER — Encounter (HOSPITAL_COMMUNITY): Payer: Self-pay | Admitting: *Deleted

## 2013-06-17 ENCOUNTER — Emergency Department (HOSPITAL_COMMUNITY): Payer: Self-pay

## 2013-06-17 ENCOUNTER — Emergency Department (HOSPITAL_COMMUNITY)
Admission: EM | Admit: 2013-06-17 | Discharge: 2013-06-17 | Disposition: A | Discharge status: Home / Self Care | Payer: Self-pay | Attending: Emergency Medicine | Admitting: Emergency Medicine

## 2013-06-17 DIAGNOSIS — Z91040 Latex allergy status: Secondary | ICD-10-CM | POA: Insufficient documentation

## 2013-06-17 DIAGNOSIS — F17200 Nicotine dependence, unspecified, uncomplicated: Secondary | ICD-10-CM | POA: Insufficient documentation

## 2013-06-17 DIAGNOSIS — Z791 Long term (current) use of non-steroidal anti-inflammatories (NSAID): Secondary | ICD-10-CM | POA: Insufficient documentation

## 2013-06-17 DIAGNOSIS — J159 Unspecified bacterial pneumonia: Secondary | ICD-10-CM | POA: Insufficient documentation

## 2013-06-17 DIAGNOSIS — J189 Pneumonia, unspecified organism: Secondary | ICD-10-CM

## 2013-06-17 MED ORDER — AZITHROMYCIN 250 MG PO TABS: ORAL_TABLET | ORAL | Status: DC

## 2013-06-17 MED ORDER — ALBUTEROL SULFATE HFA 108 (90 BASE) MCG/ACT IN AERS: 2.0000 | INHALATION_SPRAY | RESPIRATORY_TRACT | Status: DC | PRN

## 2013-06-17 NOTE — ED Notes (Signed)
Pt has had coughx2 weeks, pt states today pt coughed up blood

## 2013-06-17 NOTE — ED Provider Notes (Signed)
CSN: [id number]     Arrival date & time 06/17/13  0730 History  This chart was scribed for Gilda Crease, MD by Bennett Scrape, ED Scribe. This patient was seen in room APA19/[facility] and the patient's care was started at 7:36 AM.   Chief Complaint  Patient presents with  . Hemoptysis    The history is provided by the patient. No language interpreter was used.    HPI Comments: Chad Christian is a 37 y.o. male who presents to the Emergency Department complaining of hemoptysis described as coughing up clots of blood that began this morning. Pt states that he has been coughing up phlegm for the past 2 weeks but denies any prior episodes of the same. He denies SOB, known fevers and CP as associated symptoms. He denies having a h/o pulmonary conditions but is a current everyday smoker.  No past medical history on file. No past surgical history on file. No family history on file. History  Substance Use Topics  . Smoking status: Current Every Day Smoker    Types: Cigarettes  . Smokeless tobacco: Not on file  . Alcohol Use: No    Review of Systems  Constitutional: Negative for fever.  Respiratory: Positive for cough. Negative for shortness of breath.   Cardiovascular: Negative for chest pain.  All other systems reviewed and are negative.    Allergies  Codeine and Latex  Home Medications   Current Outpatient Rx  Name  Route  Sig  Dispense  Refill  . aspirin 325 MG tablet   Oral   Take 650 mg by mouth every 8 (eight) hours as needed. Pain         . calcium carbonate (TUMS - DOSED IN MG ELEMENTAL CALCIUM) 500 MG chewable tablet   Oral   Chew 1 tablet by mouth 3 (three) times daily as needed. Acid Reflux         . ibuprofen (ADVIL,MOTRIN) 800 MG tablet   Oral   Take 1 tablet (800 mg total) by mouth 3 (three) times daily.   21 tablet   0   . traMADol (ULTRAM) 50 MG tablet   Oral   Take 1 tablet (50 mg total) by mouth every 6 (six) hours as needed for pain.   20  tablet   0    There were no vitals taken for this visit.  Physical Exam  Nursing note and vitals reviewed. Constitutional: He is oriented to person, place, and time. He appears well-developed and well-nourished. No distress.  HENT:  Head: Normocephalic and atraumatic.  Right Ear: Hearing normal.  Left Ear: Hearing normal.  Nose: Nose normal.  Mouth/Throat: Oropharynx is clear and moist and mucous membranes are normal.  Eyes: Conjunctivae and EOM are normal. Pupils are equal, round, and reactive to light.  Neck: Normal range of motion. Neck supple.  Cardiovascular: Normal rate, regular rhythm, S1 normal and S2 normal.  Exam reveals no gallop and no friction rub.   No murmur heard. Pulmonary/Chest: Effort normal. No respiratory distress. He has wheezes. He exhibits no tenderness.  Wheezes and rhonchi in the left base  Abdominal: Soft. Normal appearance and bowel sounds are normal. There is no hepatosplenomegaly. There is no tenderness. There is no rebound, no guarding, no tenderness at McBurney's point and negative Murphy's sign. No hernia.  Musculoskeletal: Normal range of motion.  Neurological: He is alert and oriented to person, place, and time. He has normal strength. No cranial nerve deficit or sensory deficit. Coordination  normal. GCS eye subscore is 4. GCS verbal subscore is 5. GCS motor subscore is 6.  Skin: Skin is warm, dry and intact. No rash noted. No cyanosis.  Psychiatric: He has a normal mood and affect. His speech is normal and behavior is normal. Thought content normal.    ED Course  Procedures (including critical care time)  DIAGNOSTIC STUDIES: Oxygen Saturation is 100% on , normal by my interpretation.    COORDINATION OF CARE: 7:38 AM-Discussed treatment plan which includes CXR with pt at bedside and pt agreed to plan.   Labs Review Labs Reviewed - No data to display Imaging Review No results found.  MDM  Diagnosis: Community Acquired Pneumonia  Patient  presents to the ER for evaluation of a two-week history of progressive worsening cough, now blood mixed in his sputum. Emanation reveals that he is breathing comfortably, but there are rhonchi and wheezing on the left side. Chest x-ray shows pneumonia in the left lung which explains the patient's examination. Oxygenation is 100% on room air. Patient is appropriate for outpatient treatment of community-acquired pneumonia.  I personally performed the services described in this documentation, which was scribed in my presence. The recorded information has been reviewed and is accurate.     Gilda Crease, MD 06/17/13 [phone]

## 2014-12-26 IMAGING — CR DG CHEST 2V
2 series · 2 of 2 positions shown · non-contrast
Comparison: 07/04/2012

CLINICAL DATA: Cough

CHEST - 2 VIEW

[view not recorded (1 of 2)]
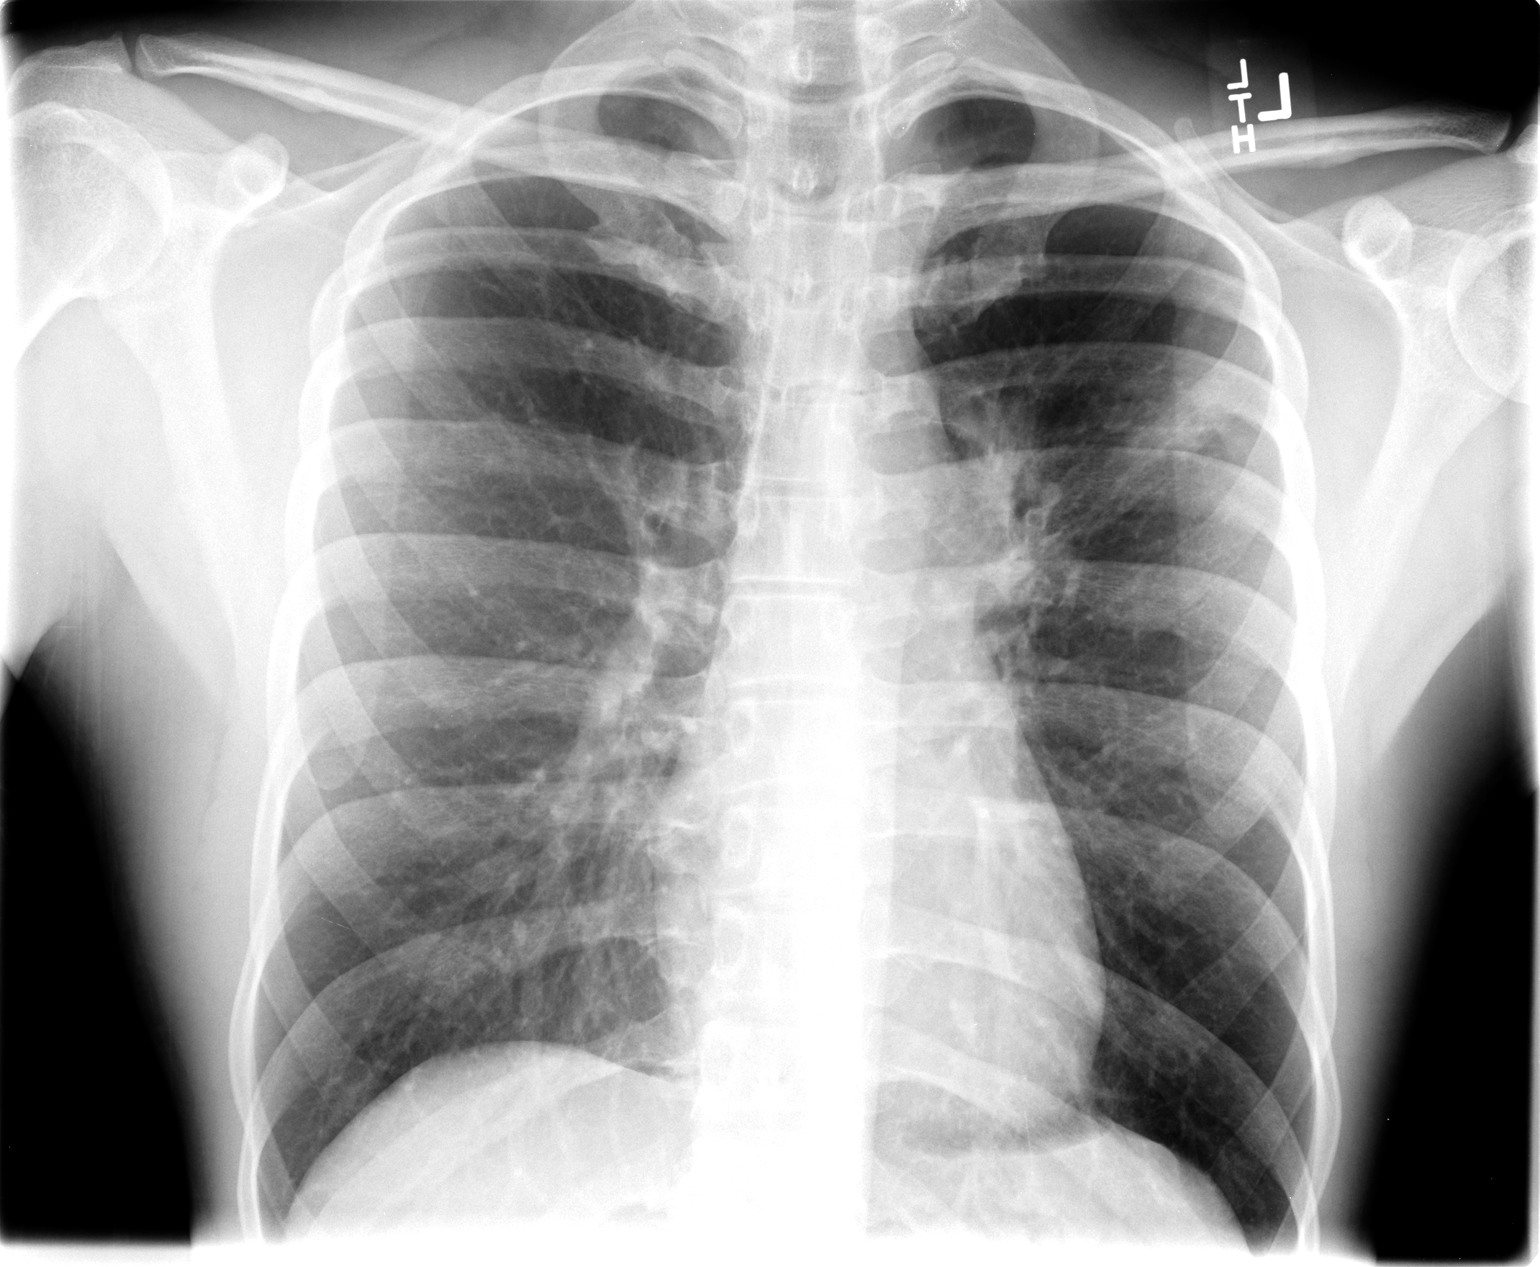

[view not recorded (2 of 2)]
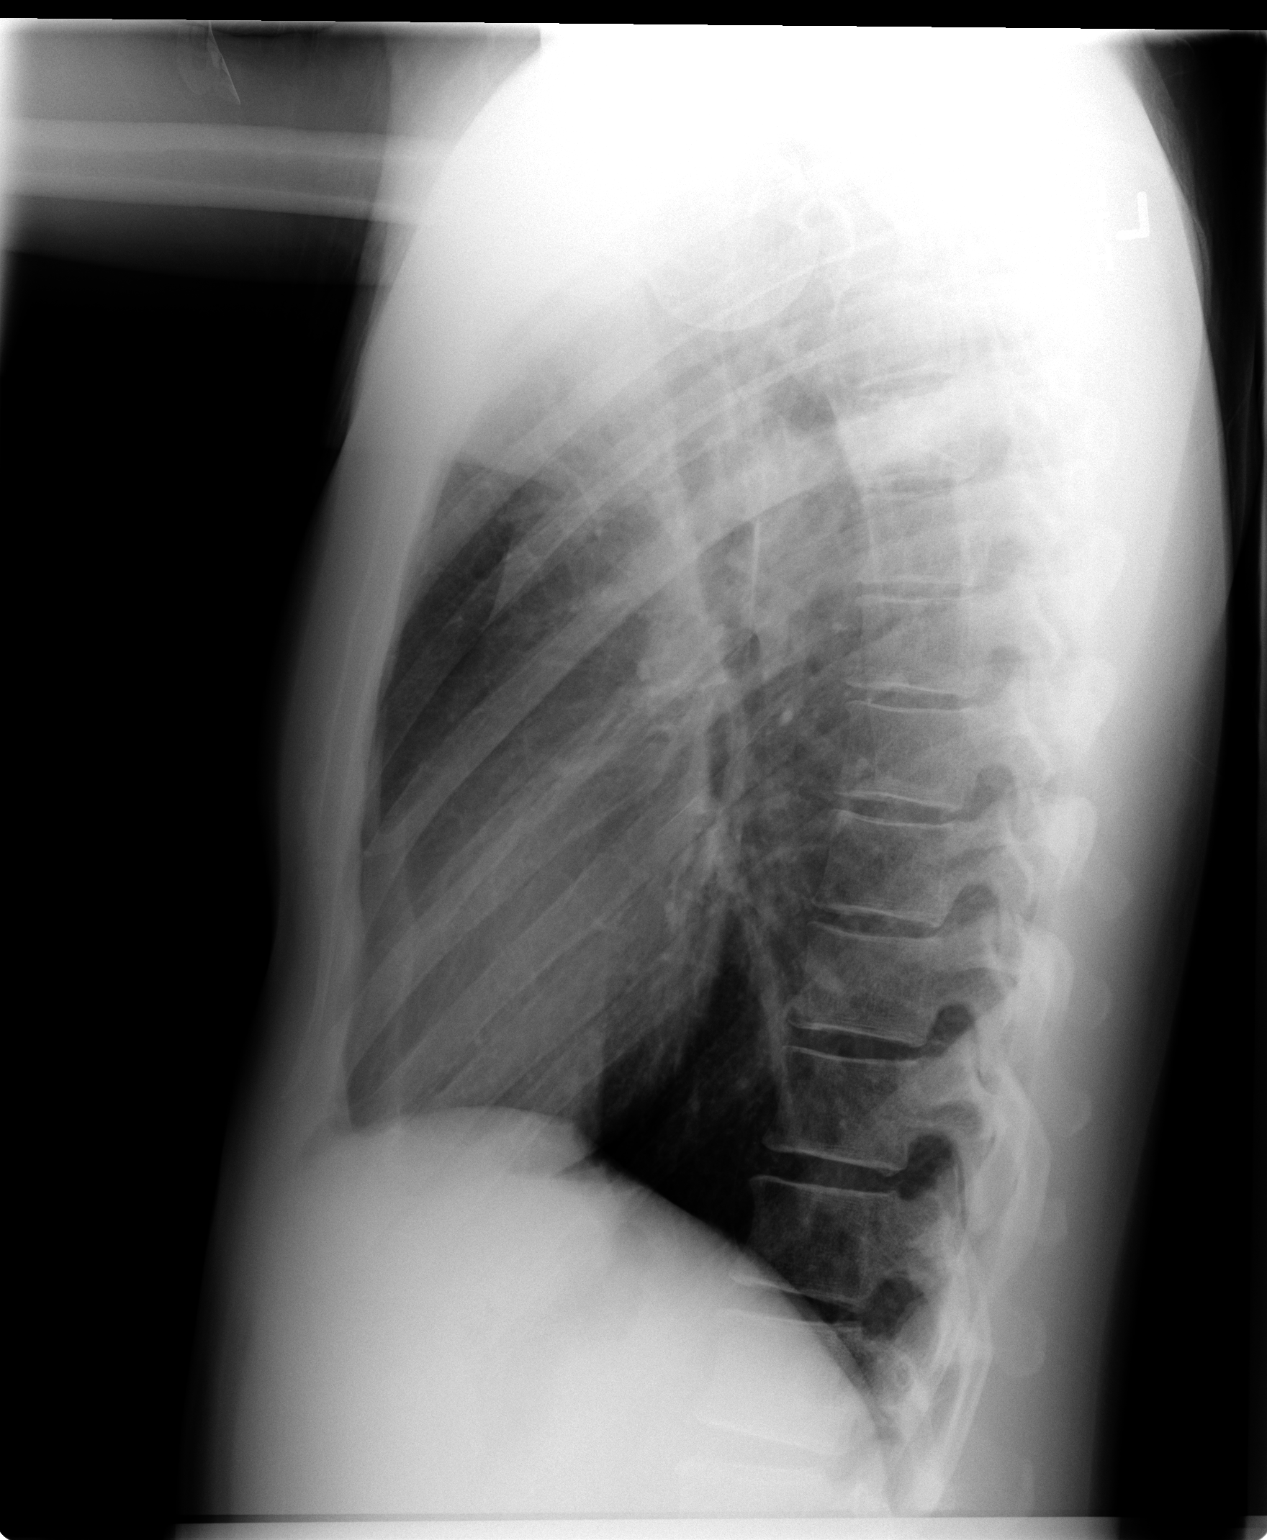

[2 of 2 positions shown; findings below may reference images not displayed]

FINDINGS: The cardiac shadow is stable.  There is prominence in the
region of the left suprahilar region as well as a focal infiltrate
in the left upper lobe.  These are new from prior exam  and
consistent with acute infiltrate.  Short-term follow-up is
recommended following appropriate therapy to assess for resolution.
No other focal abnormality is seen.
IMPRESSION: .  Left upper lobe infiltrate with associated lymphadenopathy.
Short-term follow-up following appropriate therapy is recommended
to assess for resolution.  If the area does not resolve further
evaluation by means of CT of the chest is recommended.

## 2022-05-14 ENCOUNTER — Emergency Department (HOSPITAL_COMMUNITY): Payer: Self-pay

## 2022-05-14 ENCOUNTER — Emergency Department (HOSPITAL_COMMUNITY)
Admission: EM | Admit: 2022-05-14 | Discharge: 2022-05-14 | Disposition: A | Discharge status: Home / Self Care | Payer: Self-pay | Attending: Emergency Medicine | Admitting: Emergency Medicine

## 2022-05-14 ENCOUNTER — Encounter (HOSPITAL_COMMUNITY): Payer: Self-pay

## 2022-05-14 ENCOUNTER — Other Ambulatory Visit: Payer: Self-pay

## 2022-05-14 DIAGNOSIS — N202 Calculus of kidney with calculus of ureter: Secondary | ICD-10-CM | POA: Insufficient documentation

## 2022-05-14 DIAGNOSIS — N201 Calculus of ureter: Secondary | ICD-10-CM

## 2022-05-14 DIAGNOSIS — N200 Calculus of kidney: Secondary | ICD-10-CM

## 2022-05-14 LAB — URINALYSIS, ROUTINE W REFLEX MICROSCOPIC
Bacteria, UA: NONE SEEN
Bilirubin Urine: NEGATIVE
Glucose, UA: NEGATIVE mg/dL
Ketones, ur: NEGATIVE mg/dL
Leukocytes,Ua: NEGATIVE
Nitrite: NEGATIVE
Protein, ur: NEGATIVE mg/dL
RBC / HPF: >50 RBC/hpf — ABNORMAL HIGH (ref 0–5)
Specific Gravity, Urine: 1.015 (ref 1.005–1.030)
pH: 6 (ref 5.0–8.0)

## 2022-05-14 LAB — COMPREHENSIVE METABOLIC PANEL
ALT: 23 U/L (ref 0–44)
AST: 22 U/L (ref 15–41)
Albumin: 4.3 g/dL (ref 3.5–5.0)
Alkaline Phosphatase: 71 U/L (ref 38–126)
Anion gap: 6 (ref 5–15)
BUN: 16 mg/dL (ref 6–20)
CO2: 28 mmol/L (ref 22–32)
Calcium: 9 mg/dL (ref 8.9–10.3)
Chloride: 105 mmol/L (ref 98–111)
Creatinine, Ser: 1.14 mg/dL (ref 0.61–1.24)
GFR, Estimated: >60 mL/min (ref >60)
Glucose, Bld: 117 mg/dL — ABNORMAL HIGH (ref 70–99)
Potassium: 4.2 mmol/L (ref 3.5–5.1)
Sodium: 139 mmol/L (ref 135–145)
Total Bilirubin: 0.5 mg/dL (ref 0.3–1.2)
Total Protein: 7.5 g/dL (ref 6.5–8.1)

## 2022-05-14 LAB — CBC WITH DIFFERENTIAL/PLATELET
Abs Immature Granulocytes: 0.05 10*3/uL (ref 0.00–0.07)
Basophils Absolute: 0.1 10*3/uL (ref 0.0–0.1)
Basophils Relative: 1 %
Eosinophils Absolute: 0.1 10*3/uL (ref 0.0–0.5)
Eosinophils Relative: 1 %
HCT: 45.9 % (ref 39.0–52.0)
Hemoglobin: 14.7 g/dL (ref 13.0–17.0)
Immature Granulocytes: 0 %
Lymphocytes Relative: 7 %
Lymphs Abs: 0.9 10*3/uL (ref 0.7–4.0)
MCH: 29.7 pg (ref 26.0–34.0)
MCHC: 32 g/dL (ref 30.0–36.0)
MCV: 92.7 fL (ref 80.0–100.0)
Monocytes Absolute: 0.7 10*3/uL (ref 0.1–1.0)
Monocytes Relative: 5 %
Neutro Abs: 11.3 10*3/uL — ABNORMAL HIGH (ref 1.7–7.7)
Neutrophils Relative %: 86 %
Platelets: 263 10*3/uL (ref 150–400)
RBC: 4.95 MIL/uL (ref 4.22–5.81)
RDW: 12.1 % (ref 11.5–15.5)
WBC: 13.1 10*3/uL — ABNORMAL HIGH (ref 4.0–10.5)
nRBC: 0 % (ref 0.0–0.2)

## 2022-05-14 LAB — LIPASE, BLOOD: Lipase: 27 U/L (ref 11–51)

## 2022-05-14 MED ORDER — ONDANSETRON 4 MG PO TBDP: 4.0000 mg | ORAL_TABLET | Freq: Three times a day (TID) | ORAL | 0 refills | Status: AC | PRN

## 2022-05-14 MED ORDER — SODIUM CHLORIDE 0.9 % IV BOLUS
1000.0000 mL | Freq: Once | INTRAVENOUS | Status: AC
  Administered 2022-05-14: 1000 mL via INTRAVENOUS

## 2022-05-14 MED ORDER — OXYCODONE-ACETAMINOPHEN 5-325 MG PO TABS: 1.0000 | ORAL_TABLET | Freq: Four times a day (QID) | ORAL | 0 refills | Status: DC | PRN

## 2022-05-14 MED ORDER — NAPROXEN 500 MG PO TABS: 500.0000 mg | ORAL_TABLET | Freq: Two times a day (BID) | ORAL | 0 refills | Status: AC

## 2022-05-14 MED ORDER — ONDANSETRON 4 MG PO TBDP: 4.0000 mg | ORAL_TABLET | Freq: Three times a day (TID) | ORAL | Status: DC | PRN

## 2022-05-14 MED ORDER — SODIUM CHLORIDE 0.9 % IV SOLN: INTRAVENOUS | Status: DC

## 2022-05-14 NOTE — ED Triage Notes (Signed)
Patient brought in by [facility] for abdominal pain.  Patient states that he was told a few years ago that he needed his gallbladder removed.  It has been a constant pain but worse this morning.

## 2022-05-14 NOTE — ED Notes (Signed)
Gone to CT

## 2022-05-14 NOTE — ED Provider Notes (Signed)
[facility] EMERGENCY DEPARTMENT Provider Note   CSN: [id number] Arrival date & time: 05/14/22  1111     History  No chief complaint on file.   Chad Christian is a 46 y.o. male.  HPI This patient is a 46 year old male, he has no chronic medical conditions and takes no daily medicines.  States that he was driving this morning on his way to work when he had several different episodes of what he describes as diarrhea which was 3 loose to soft bowel movements which is very abnormal for him, he developed some left mid and left upper quadrant abdominal tenderness and then developed a feeling of overwhelming nausea like he was going to throw up but he did not.  All of that seems to have improved somewhat and he is not nauseated but still having a discomfort in his abdomen.  He does reporting having an excessive amount of chocolate cake last night, he had 2 sodas and this morning had another soda but is not had anything else to drink or eat.  He denies vomiting, denies fevers or chills, denies coughing or shortness of breath.  He has never had abdominal surgery.  He does not drink much alcohol and he did not have any last night and he does not take any significant amount of over-the-counter anti-inflammatories    Home Medications Prior to Admission medications   Medication Sig Start Date End Date Taking? Authorizing Provider  ibuprofen (ADVIL,MOTRIN) 800 MG tablet Take 1 tablet (800 mg total) by mouth 3 (three) times daily. 07/12/12  Yes Annamarie Dawley, MD  naproxen (NAPROSYN) 500 MG tablet Take 1 tablet (500 mg total) by mouth 2 (two) times daily with a meal. 05/14/22  Yes Eber Hong, MD  naproxen sodium (ALEVE) 220 MG tablet Take 220 mg by mouth daily as needed (pain).   Yes [provider]  ondansetron (ZOFRAN-ODT) 4 MG disintegrating tablet Take 1 tablet (4 mg total) by mouth every 8 (eight) hours as needed for nausea. 05/14/22  Yes Eber Hong, MD  oxyCODONE-acetaminophen  (PERCOCET) 5-325 MG tablet Take 1 tablet by mouth every 6 (six) hours as needed. 05/14/22  Yes Eber Hong, MD      Allergies    Codeine and Latex    Review of Systems   Review of Systems  All other systems reviewed and are negative.   Physical Exam Updated Vital Signs BP (!) 124/95   Pulse 67   Temp 98 F (36.7 C)   Resp 16   Ht 1.829 m (6')   Wt 82.6 kg   SpO2 97%   BMI 24.68 kg/m  Physical Exam Vitals and nursing note reviewed.  Constitutional:      General: He is not in acute distress.    Appearance: He is well-developed.  HENT:     Head: Normocephalic and atraumatic.     Mouth/Throat:     Pharynx: No oropharyngeal exudate.  Eyes:     General: No scleral icterus.       Right eye: No discharge.        Left eye: No discharge.     Conjunctiva/sclera: Conjunctivae normal.     Pupils: Pupils are equal, round, and reactive to light.  Neck:     Thyroid: No thyromegaly.     Vascular: No JVD.  Cardiovascular:     Rate and Rhythm: Normal rate and regular rhythm.     Heart sounds: Normal heart sounds. No murmur heard.    No  friction rub. No gallop.  Pulmonary:     Effort: Pulmonary effort is normal. No respiratory distress.     Breath sounds: Normal breath sounds. No wheezing or rales.  Abdominal:     General: Bowel sounds are normal. There is no distension.     Palpations: Abdomen is soft. There is no mass.     Tenderness: There is no abdominal tenderness.  Musculoskeletal:        General: No tenderness. Normal range of motion.     Cervical back: Normal range of motion and neck supple.  Lymphadenopathy:     Cervical: No cervical adenopathy.  Skin:    General: Skin is warm and dry.     Findings: No erythema or rash.  Neurological:     Mental Status: He is alert.     Coordination: Coordination normal.  Psychiatric:        Behavior: Behavior normal.     ED Results / Procedures / Treatments   Labs (all labs ordered are listed, but only abnormal results are  displayed) Labs Reviewed  COMPREHENSIVE METABOLIC PANEL - Abnormal; Notable for the following components:      Result Value   Glucose, Bld 117 (*)    All other components within normal limits  CBC WITH DIFFERENTIAL/PLATELET - Abnormal; Notable for the following components:   WBC 13.1 (*)    Neutro Abs 11.3 (*)    All other components within normal limits  URINALYSIS, ROUTINE W REFLEX MICROSCOPIC - Abnormal; Notable for the following components:   Hgb urine dipstick LARGE (*)    RBC / HPF >50 (*)    All other components within normal limits  LIPASE, BLOOD    EKG None  Radiology CT Renal Stone Study  Result Date: 05/14/2022 CLINICAL DATA:  Flank pain. EXAM: CT ABDOMEN AND PELVIS WITHOUT CONTRAST TECHNIQUE: Multidetector CT imaging of the abdomen and pelvis was performed following the standard protocol without IV contrast. RADIATION DOSE REDUCTION: This exam was performed according to the departmental dose-optimization program which includes automated exposure control, adjustment of the mA and/or kV according to patient size and/or use of iterative reconstruction technique. COMPARISON:  CT abdomen pelvis 07/21/2011. FINDINGS: Lower chest: No acute abnormality. Hepatobiliary: No focal liver abnormality is seen. No gallstones, gallbladder wall thickening, or biliary dilatation. Pancreas: Unremarkable. No pancreatic ductal dilatation or surrounding inflammatory changes. Spleen: Normal in size without focal abnormality. Adrenals/Urinary Tract: Adrenal glands are unremarkable. A 0.3 cm calculus is noted at the left UVJ with mild dilatation of the left ureter (series 2, image 76; series 6, image 72). Two additional punctate nonobstructive calyceal stones are noted at the upper pole of the left kidney as well as at the middle third of the left kidney (series 2, images 25, 33). No left hydronephrosis or perinephric fat stranding. Right kidney is unremarkable. Urinary bladder is otherwise unremarkable.  Stomach/Bowel: Stomach is within normal limits. Appendix appears normal. No evidence of bowel wall thickening, distention, or inflammatory changes. Vascular/Lymphatic: No significant vascular findings are present. No enlarged abdominal or pelvic lymph nodes. Reproductive: Prostate is unremarkable. Partially visualized small right hydrocele. Other: No abdominal wall hernia or abnormality. No abdominopelvic ascites. Musculoskeletal: No acute or suspicious osseous findings. IMPRESSION: 1. A 0.3 cm stone is noted at the left UVJ with mild dilatation of the left ureter. No left hydronephrosis. 2. A few additional punctate non-obstructing calyceal stones are noted in the middle and upper thirds of the left kidney. Electronically Signed   By: Vernona Rieger  Beola Cord M.D.   On: 05/14/2022 14:32   DG Abd Acute W/Chest  Result Date: 05/14/2022 CLINICAL DATA:  46 year old male with acute abdominal pain. EXAM: DG ABDOMEN ACUTE WITH 1 VIEW CHEST COMPARISON:  03/17/2013 prior radiographs FINDINGS: There is no evidence of dilated bowel loops or free intraperitoneal air. No radiopaque calculi or other significant radiographic abnormality is seen. Heart size and mediastinal contours are within normal limits. Both lungs are clear. IMPRESSION: Negative abdominal radiographs.  No acute cardiopulmonary disease. Electronically Signed   By: Harmon Pier M.D.   On: 05/14/2022 11:57    Procedures Procedures    Medications Ordered in ED Medications  0.9 %  sodium chloride infusion (0 mLs Intravenous Stopped 05/14/22 1218)  ondansetron (ZOFRAN-ODT) disintegrating tablet 4 mg (has no administration in time range)  sodium chloride 0.9 % bolus 1,000 mL (0 mLs Intravenous Stopped 05/14/22 1426)    ED Course/ Medical Decision Making/ A&P                           Medical Decision Making Amount and/or Complexity of Data Reviewed Labs: ordered. Radiology: ordered.  Risk Prescription drug management.   This patient presents to the ED  for concern of abdominal pain with nausea and loose stools differential diagnosis includes gastroenteritis, food poisoning, abdomen seems benign so would make appendicitis or cholecystitis much less likely, consider pancreatitis    Additional history obtained:  Additional history obtained from electronic medical record External records from outside source obtained and reviewed including no recent visits, it has been many years since he has been in the emergency department   Lab Tests:  I Ordered, and personally interpreted labs.  The pertinent results include: Urinalysis with hematuria, labs are otherwise unremarkable   Imaging Studies ordered:  I ordered imaging studies including CT scan of the abdomen and pelvis I independently visualized and interpreted imaging which showed distal ureteral stone 0.3 cm I agree with the radiologist interpretation   Medicines ordered and prescription drug management:  I ordered medication including IV fluids, Zofran for nausea and dehydration Reevaluation of the patient after these medicines showed that the patient improved, patient slept throughout I have reviewed the patients home medicines and have made adjustments as needed   Problem List / ED Course:  Well-appearing, appears to have a kidney stone, informed of his results with good understanding.  Vital signs unremarkable, no signs of sepsis or infection, patient agreeable with discharge   Social Determinants of Health:  None           Final Clinical Impression(s) / ED Diagnoses Final diagnoses:  Ureterolithiasis  Kidney stone on left side    Rx / DC Orders ED Discharge Orders          Ordered    naproxen (NAPROSYN) 500 MG tablet  2 times daily with meals        05/14/22 1444    ondansetron (ZOFRAN-ODT) 4 MG disintegrating tablet  Every 8 hours PRN        05/14/22 1444    oxyCODONE-acetaminophen (PERCOCET) 5-325 MG tablet  Every 6 hours PRN        05/14/22 1444               Eber Hong, MD 05/14/22 1447

## 2022-05-14 NOTE — Discharge Instructions (Signed)
Your testing today shows that you have a small kidney stone that is getting close to being passed.  This may happen anytime in the next day but may last as long as a week.  It may cause pain and vomiting and blood in your urine.  Please take the following medications to help  Please take Naprosyn, 500mg  by mouth twice daily as needed for pain - this in an antiinflammatory medicine (NSAID) and is similar to ibuprofen - many people feel that it is stronger than ibuprofen and it is easier to take since it is a smaller pill.  Please use this only for 1 week - if your pain persists, you will need to follow up with your doctor in the office for ongoing guidance and pain control.  Zofran is a medication which can help with nausea.  You may take 4 mg by mouth every 6 hours as needed if you are an adult, if your child under the age of 6 take half of a tablet or 2 mg every 6 hours as needed.  This should dissolve on your tongue within a short timeframe.  Wait about 30 minutes after taking it to help with drinking clear liquids.  Opiate medications such as Percocet or Vicodin or morphine are very strong pain medications that are also very addictive if they are taken for too long, even a single dose can predispose someone to become addicted so be very careful when taking this medication.  You should take the smallest amount possible to relieve your pain, these medications may cause constipation or nausea, please follow-up with your doctor if you are having the need for ongoing pain control despite these medications.  Additionally please be aware that these medications may cause sedation or sleepiness or alter judgment so you should not take this if you are driving a vehicle or operating heavy machinery or taking care of small children.  Thank you for allowing to treat you in the emergency department today.  After reviewing your examination and potential testing that was done it appears that you are safe to go home.   I would like for you to follow-up with your doctor within the next several days, have them obtain your results and follow-up with them to review all of these tests.  If you should develop severe or worsening symptoms return to the emergency department immediately

## 2022-05-14 NOTE — ED Triage Notes (Signed)
Patient states that he has had several episodes of diarrhea and also nauseated.  Has had 3 bowel movements today.

## 2023-02-19 ENCOUNTER — Other Ambulatory Visit: Payer: Self-pay

## 2023-02-19 ENCOUNTER — Encounter (HOSPITAL_COMMUNITY): Payer: Self-pay

## 2023-02-19 ENCOUNTER — Emergency Department (HOSPITAL_COMMUNITY)
Admission: EM | Admit: 2023-02-19 | Discharge: 2023-02-19 | Disposition: A | Discharge status: Home / Self Care | Payer: Self-pay | Attending: Emergency Medicine | Admitting: Emergency Medicine

## 2023-02-19 DIAGNOSIS — L0201 Cutaneous abscess of face: Secondary | ICD-10-CM | POA: Insufficient documentation

## 2023-02-19 DIAGNOSIS — Z91040 Latex allergy status: Secondary | ICD-10-CM | POA: Insufficient documentation

## 2023-02-19 MED ORDER — LIDOCAINE-EPINEPHRINE (PF) 1 %-1:200000 IJ SOLN
20.0000 mL | Freq: Once | INTRAMUSCULAR | Status: AC
  Administered 2023-02-19: 20 mL
  Filled 2023-02-19: qty 30

## 2023-02-19 MED ORDER — DOXYCYCLINE HYCLATE 100 MG PO TABS
100.0000 mg | ORAL_TABLET | Freq: Once | ORAL | Status: AC
  Administered 2023-02-19: 100 mg via ORAL
  Filled 2023-02-19: qty 1

## 2023-02-19 MED ORDER — DOXYCYCLINE HYCLATE 100 MG PO CAPS: 100.0000 mg | ORAL_CAPSULE | Freq: Two times a day (BID) | ORAL | 0 refills | Status: AC

## 2023-02-19 NOTE — Discharge Instructions (Signed)
Apply warm compresses several times a day.  If the abscess seems like it is getting bigger again, it may need a more definitive procedure to drain it.  For that, I would like you to see an ear nose throat specialist.  You may take ibuprofen and/or acetaminophen as needed for pain.

## 2023-02-19 NOTE — ED Triage Notes (Signed)
Pt arrived via POV c/o abscess on right lateral face. Pt reports last taking 600mg  Ibuprofen apprx 2hrs PTA. Pt reports trying warm compresses, and pimple patches w/o relief. Pt believes it may be an infected hair follicle.

## 2023-02-19 NOTE — ED Provider Notes (Signed)
[place] EMERGENCY DEPARTMENT AT [facility] Provider Note   CSN: [id number] Arrival date & time: 02/19/23  0507     History  Chief Complaint  Patient presents with   Abscess    Chad Christian is a 47 y.o. male.  The history is provided by the patient.  Abscess He has had an abscess on the right side of his face which has been present for about the last month.  He has tried a variety of measures including warm compresses and pimple patches without relief.  Tonight, pain was increased and not relieved with ibuprofen.  He denies fever, chills, sweats.   Home Medications Prior to Admission medications   Medication Sig Start Date End Date Taking? Authorizing Provider  doxycycline (VIBRAMYCIN) 100 MG capsule Take 1 capsule (100 mg total) by mouth 2 (two) times daily. One po bid x 7 days 02/19/23  Yes Dione Booze, MD  ibuprofen (ADVIL,MOTRIN) 800 MG tablet Take 1 tablet (800 mg total) by mouth 3 (three) times daily. 07/12/12   Annamarie Dawley, MD  naproxen (NAPROSYN) 500 MG tablet Take 1 tablet (500 mg total) by mouth 2 (two) times daily with a meal. 05/14/22   Eber Hong, MD  naproxen sodium (ALEVE) 220 MG tablet Take 220 mg by mouth daily as needed (pain).    [provider]  ondansetron (ZOFRAN-ODT) 4 MG disintegrating tablet Take 1 tablet (4 mg total) by mouth every 8 (eight) hours as needed for nausea. 05/14/22   Eber Hong, MD      Allergies    Codeine and Latex    Review of Systems   Review of Systems  All other systems reviewed and are negative.   Physical Exam Updated Vital Signs BP (!) 125/104 (BP Location: Left Arm)   Pulse 86   Temp 97.8 F (36.6 C) (Oral)   Resp 18   Ht 6\' 2"  (1.88 m)   Wt 83.9 kg   SpO2 99%   BMI 23.75 kg/m  Physical Exam Vitals and nursing note reviewed.   48 year old male, resting comfortably and in no acute distress. Vital signs are significant for elevated diastolic blood pressure. Oxygen saturation is 99%,  which is normal. Head is normocephalic and atraumatic. PERRLA, EOMI. Oropharynx is clear.  Abscess is present in the right malar area measuring about 2 cm in diameter.  It is fluctuant but with minimal erythema. Neck is nontender and supple without adenopathy. Lungs are clear without rales, wheezes, or rhonchi. Chest is nontender. Heart has regular rate and rhythm without murmur. Abdomen is soft, flat, nontender. Skin is warm and dry without rash. Neurologic: Mental status is normal, cranial nerves are intact, moves all extremities equally.  ED Results / Procedures / Treatments    Procedures .Marland KitchenIncision and Drainage  Date/Time: 02/19/2023 5:48 AM  Performed by: Dione Booze, MD Authorized by: Dione Booze, MD   Consent:    Consent obtained:  Verbal   Consent given by:  Patient   Risks, benefits, and alternatives were discussed: yes     Risks discussed:  Bleeding, incomplete drainage and pain   Alternatives discussed:  Alternative treatment Universal protocol:    Procedure explained and questions answered to patient or proxy's satisfaction: yes     Relevant documents present and verified: yes     Required blood products, implants, devices, and special equipment available: yes     Site/side marked: yes     Immediately prior to procedure, a time out was  called: yes     Patient identity confirmed:  Verbally with patient and arm band Location:    Type:  Abscess   Location:  Head   Head location:  Face Pre-procedure details:    Skin preparation:  Antiseptic wash Sedation:    Sedation type:  None Anesthesia:    Anesthesia method:  Local infiltration   Local anesthetic:  Lidocaine 2% WITH epi Procedure type:    Complexity:  Simple Procedure details:    Ultrasound guidance: no     Needle aspiration: yes     Incision types:  Single straight   Incision depth:  Subcutaneous   Drainage:  Purulent   Drainage amount:  Moderate   Wound treatment:  Wound left open   Packing  materials:  None Post-procedure details:    Procedure completion:  Tolerated well, no immediate complications     Medications Ordered in ED Medications  doxycycline (VIBRA-TABS) tablet 100 mg (has no administration in time range)  lidocaine-EPINEPHrine (PF) (XYLOCAINE-EPINEPHrine) 1 %-1:200000 (PF) injection 20 mL (20 mLs Infiltration Given 02/19/23 0537)    ED Course/ Medical Decision Making/ A&P                             Medical Decision Making Risk Prescription drug management.   Facial abscess.  I first attempted needle aspiration and got about 2 mL of thick, purulent material but I was concerned that there was still significant amount of purulent material left behind.  I made a small incision with an 11 blade scalpel and was able to express a similar amount of thick sebaceous material.  I have ordered a dose of doxycycline and I am discharging him with a prescription for doxycycline.  Because this is an area where I am concerned about scarring, I have advised him to follow-up with ENT if the abscess recurs.  Final Clinical Impression(s) / ED Diagnoses Final diagnoses:  Abscess of face    Rx / DC Orders ED Discharge Orders          Ordered    doxycycline (VIBRAMYCIN) 100 MG capsule  2 times daily        02/19/23 0541              Dione Booze, MD 02/19/23 [phone]
